# Patient Record
Sex: Male | Born: 1995 | Race: Black or African American | Hispanic: No | Marital: Married | Smoking: Current every day smoker
Health system: Southern US, Community
[De-identification: ages and names within clinical notes are randomized; demographics above are authoritative.]

## PROBLEM LIST (undated history)

## (undated) DIAGNOSIS — J45909 Unspecified asthma, uncomplicated: Secondary | ICD-10-CM

## (undated) HISTORY — PX: HERNIA REPAIR: SHX51

---
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
## 2017-02-22 ENCOUNTER — Encounter: Payer: Self-pay | Admitting: *Deleted

## 2017-02-22 ENCOUNTER — Emergency Department
Admission: EM | Admit: 2017-02-22 | Discharge: 2017-02-22 | Disposition: A | Discharge status: Home / Self Care | Payer: Self-pay | Attending: Emergency Medicine | Admitting: Emergency Medicine

## 2017-02-22 DIAGNOSIS — R197 Diarrhea, unspecified: Secondary | ICD-10-CM | POA: Insufficient documentation

## 2017-02-22 LAB — CBC
HCT: 40.1 % (ref 40.0–52.0)
Hemoglobin: 13.6 g/dL (ref 13.0–18.0)
MCH: 28.9 pg (ref 26.0–34.0)
MCHC: 33.9 g/dL (ref 32.0–36.0)
MCV: 85.1 fL (ref 80.0–100.0)
PLATELETS: 406 10*3/uL (ref 150–440)
RBC: 4.71 MIL/uL (ref 4.40–5.90)
RDW: 14.1 % (ref 11.5–14.5)
WBC: 6 10*3/uL (ref 3.8–10.6)

## 2017-02-22 LAB — COMPREHENSIVE METABOLIC PANEL
ALK PHOS: 64 U/L (ref 38–126)
ALT: 22 U/L (ref 17–63)
AST: 29 U/L (ref 15–41)
Albumin: 4.3 g/dL (ref 3.5–5.0)
Anion gap: 10 (ref 5–15)
BILIRUBIN TOTAL: 0.5 mg/dL (ref 0.3–1.2)
BUN: 7 mg/dL (ref 6–20)
CALCIUM: 9.6 mg/dL (ref 8.9–10.3)
CHLORIDE: 106 mmol/L (ref 101–111)
CO2: 23 mmol/L (ref 22–32)
CREATININE: 0.89 mg/dL (ref 0.61–1.24)
GFR calc Af Amer: >60 mL/min (ref >60)
Glucose, Bld: 97 mg/dL (ref 65–99)
Potassium: 3.5 mmol/L (ref 3.5–5.1)
Sodium: 139 mmol/L (ref 135–145)
Total Protein: 8 g/dL (ref 6.5–8.1)

## 2017-02-22 LAB — URINALYSIS, COMPLETE (UACMP) WITH MICROSCOPIC
BILIRUBIN URINE: NEGATIVE
Bacteria, UA: NONE SEEN
Glucose, UA: NEGATIVE mg/dL
Hgb urine dipstick: NEGATIVE
Ketones, ur: NEGATIVE mg/dL
LEUKOCYTES UA: NEGATIVE
Nitrite: NEGATIVE
PH: 5 (ref 5.0–8.0)
Protein, ur: NEGATIVE mg/dL
RBC / HPF: NONE SEEN RBC/hpf (ref 0–5)
SPECIFIC GRAVITY, URINE: 1.014 (ref 1.005–1.030)
SQUAMOUS EPITHELIAL / LPF: NONE SEEN

## 2017-02-22 LAB — ETHANOL: Alcohol, Ethyl (B): <10 mg/dL (ref <10)

## 2017-02-22 LAB — LIPASE, BLOOD: LIPASE: 23 U/L (ref 11–51)

## 2017-02-22 MED ORDER — LOPERAMIDE HCL 2 MG PO CAPS
4.0000 mg | ORAL_CAPSULE | Freq: Once | ORAL | Status: AC
  Administered 2017-02-22: 4 mg via ORAL
  Filled 2017-02-22: qty 2

## 2017-02-22 MED ORDER — LOPERAMIDE HCL 2 MG PO TABS: 2.0000 mg | ORAL_TABLET | Freq: Four times a day (QID) | ORAL | 0 refills | Status: DC | PRN

## 2017-02-22 NOTE — ED Triage Notes (Signed)
Pt reports diarrhea for several days.  No vomiting.  No abd pain.  Pt drinks etoh everyday.  Pt reports blood in stool 2 days ago.  Pt alert.  Pt reports etoh use today.

## 2017-02-22 NOTE — ED Provider Notes (Signed)
[facility] Emergency Department Provider Note  Time seen: 7:00 PM  I have reviewed the triage vital signs and the nursing notes.   HISTORY  Chief Complaint Diarrhea    HPI Alex Ochoa is a 21 y.o. male With no past medical history who presents to the emergency Department for diarrhea. According to the patient 3 days ago he had a hard stool. Noted some blood on the toilet paper. He states yesterday and today he has had significant amounts of diarrhea up to 10 episodes per day. Denies any blood or black in his stool. Denies any nausea vomiting or abdominal pain. Denies any fever. 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 Has not tried any over-the-counter diarrhea medications as of yet.  No past medical history on file.  There are no active problems to display for this patient.   No past surgical history on file.  Prior to Admission medications   Not on File    No Known Allergies  No family history on file.  Social History Social History  Substance Use Topics  . Smoking status: Never Smoker  . Smokeless tobacco: Never Used  . Alcohol use Yes    Review of Systems Constitutional: Negative for fever. Cardiovascular: Negative for chest pain. Respiratory: Negative for shortness of breath. Gastrointestinal: Negative for abdominal pain. Positive for diarrhea. Genitourinary: Negative for dysuria All other ROS negative  ____________________________________________   PHYSICAL EXAM:  VITAL SIGNS: ED Triage Vitals  Enc Vitals Group     BP 02/22/17 1549 118/61     Pulse Rate 02/22/17 1549 80     Resp 02/22/17 1549 18     Temp 02/22/17 1549 98.6 F (37 C)     Temp Source 02/22/17 1549 Oral     SpO2 02/22/17 1549 99 %     Weight 02/22/17 1549 140 lb (63.5 kg)     Height 02/22/17 1549  (1.651 m)     Head Circumference --    Peak Flow --      Pain Score 02/22/17 1548 3     Pain Loc --      Pain Edu? --      Excl. in GC? --     Constitutional: Alert and oriented. Well appearing and in no distress. Eyes: Normal exam ENT   Head: Normocephalic and atraumatic.   Mouth/Throat: Mucous membranes are moist. Cardiovascular: Normal rate, regular rhythm. No murmur Respiratory: Normal respiratory effort without tachypnea nor retractions. Breath sounds are clear Gastrointestinal: Soft and nontender. No distention.   Musculoskeletal: Nontender with normal range of motion in all extremities. Neurologic:  Normal speech and language. No gross focal neurologic deficits  Skin:  Skin is warm, dry and intact.  Psychiatric: Mood and affect are normal.   ____________________________________________   INITIAL IMPRESSION / ASSESSMENT AND PLAN / ED COURSE  Pertinent labs & imaging results that were available during my care of the patient were reviewed by me and considered in my medical decision making (see chart for details).  patient presents to the emergency Department for diarrhea. Denies nausea vomiting or abdominal pain. Differential this time include infectious diarrhea, gastroenteritis, enteritis, colitis or diverticulitis. Overall the patient appears extremely well the emergency department with a completely nontender abdominal exam. Patient's labs are normal including normal white blood cell count. No signs of dehydration on blood work. We will treat  with loperamide. We will excuse from work. I discussed supportive care at home including plenty fluids and continue use of loperamide. Patient agreeable to plan. I also discussed return precautions.  ____________________________________________   FINAL CLINICAL IMPRESSION(S) / ED DIAGNOSES  diarrhea    Minna Antis, MD 02/22/17 1902

## 2017-03-22 ENCOUNTER — Emergency Department: Payer: Self-pay

## 2017-03-22 ENCOUNTER — Emergency Department
Admission: EM | Admit: 2017-03-22 | Discharge: 2017-03-22 | Disposition: A | Discharge status: Home / Self Care | Payer: Self-pay | Attending: Emergency Medicine | Admitting: Emergency Medicine

## 2017-03-22 DIAGNOSIS — S300XXA Contusion of lower back and pelvis, initial encounter: Secondary | ICD-10-CM | POA: Insufficient documentation

## 2017-03-22 DIAGNOSIS — Y9301 Activity, walking, marching and hiking: Secondary | ICD-10-CM | POA: Insufficient documentation

## 2017-03-22 DIAGNOSIS — Y999 Unspecified external cause status: Secondary | ICD-10-CM | POA: Insufficient documentation

## 2017-03-22 DIAGNOSIS — W108XXA Fall (on) (from) other stairs and steps, initial encounter: Secondary | ICD-10-CM | POA: Insufficient documentation

## 2017-03-22 DIAGNOSIS — Y929 Unspecified place or not applicable: Secondary | ICD-10-CM | POA: Insufficient documentation

## 2017-03-22 MED ORDER — TRAMADOL HCL 50 MG PO TABS: 50.0000 mg | ORAL_TABLET | Freq: Four times a day (QID) | ORAL | 0 refills | Status: DC | PRN

## 2017-03-22 MED ORDER — IBUPROFEN 600 MG PO TABS: 600.0000 mg | ORAL_TABLET | Freq: Three times a day (TID) | ORAL | 0 refills | Status: DC | PRN

## 2017-03-22 NOTE — ED Triage Notes (Signed)
Tailbone pain after falling down stairs yesterday. Pain worse with sitting. Pt ambulates. Pt alert and oriented X4, active, cooperative, pt in NAD. RR even and unlabored, color WNL.

## 2017-03-22 NOTE — ED Provider Notes (Signed)
[facility] Emergency Department Provider Note   ____________________________________________   None    (approximate)  I have reviewed the triage vital signs and the nursing notes.   HISTORY  Chief Complaint Tailbone Pain    HPI Shelby DubinDeion H Venditto is a 21 y.o. male patient complain of tailbone pain status post falling down stairs yesterday. Patient stated pain increases with sitting. Patient denies any radicular component to his pain. Patient denies bladder or bowel dysfunction.Patient rates his pain as a 10 over 10. Patient described a pain as "achy". No palliative measures for complaint.   History reviewed. No pertinent past medical history.  There are no active problems to display for this patient.   History reviewed. No pertinent surgical history.  Prior to Admission medications   Medication Sig Start Date End Date Taking? Authorizing Provider  loperamide (IMODIUM A-D) 2 MG tablet Take 1 tablet (2 mg total) by mouth 4 (four) times daily as needed for diarrhea or loose stools. 02/22/17   Minna AntisPaduchowski, Kevin, MD    Allergies Patient has no known allergies.  No family history on file.  Social History Social History  Substance Use Topics  . Smoking status: Never Smoker  . Smokeless tobacco: Never Used  . Alcohol use Yes    Review of Systems Constitutional: No fever/chills Eyes: No visual changes. ENT: No sore throat. Cardiovascular: Denies chest pain. Respiratory: Denies shortness of breath. Gastrointestinal: No abdominal pain.  No nausea, no vomiting.  No diarrhea.  No constipation. Genitourinary: Negative for dysuria. Musculoskeletal: Tailbone pain Skin: Negative for rash. Neurological: Negative for headaches, focal weakness or numbness.   ____________________________________________   PHYSICAL EXAM:  VITAL SIGNS: ED Triage Vitals  Enc Vitals Group     BP 03/22/17 0846 (!) 157/95     Pulse Rate 03/22/17 0846 81     Resp  03/22/17 0846 16     Temp 03/22/17 0846 97.7 F (36.5 C)     Temp Source 03/22/17 0846 Oral     SpO2 03/22/17 0846 98 %     Weight 03/22/17 0846 140 lb (63.5 kg)     Height 03/22/17 0846 5\' 5"  (1.651 m)     Head Circumference --      Peak Flow --      Pain Score 03/22/17 0845 10     Pain Loc --      Pain Edu? --      Excl. in GC? --     Constitutional: Alert and oriented. Well appearing and in no acute distress. Cardiovascular: Normal rate, regular rhythm. Grossly normal heart sounds.  Good peripheral circulation. Elevated blood pressure Respiratory: Normal respiratory effort.  No retractions. Lungs CTAB. Musculoskeletal: No obvious lumbar deformity. Patient moderate guarding palpation L4-S1.  Neurologic:  Normal speech and language. No gross focal neurologic deficits are appreciated. No gait instability. Skin:  Skin is warm, dry and intact. No rash noted. Psychiatric: Mood and affect are normal. Speech and behavior are normal.  ____________________________________________   LABS (all labs ordered are listed, but only abnormal results are displayed)  Labs Reviewed - No data to display ____________________________________________  EKG   ____________________________________________  RADIOLOGY  No results found. No acute findings x-ray of the coccyx.  ___ _________________________________________   PROCEDURES  Procedure(s) performed: None  Procedures  Critical Care performed: No  ____________________________________________   INITIAL IMPRESSION / ASSESSMENT AND PLAN / ED COURSE  As part of my medical decision making, I reviewed the following data within the electronic  MEDICAL RECORD NUMBER    Pain secondary to coccyx contusion. Discussed negative x-ray finding with patient. Patient given discharge care instruction. Patient given a work note and [facility].       ____________________________________________   FINAL CLINICAL IMPRESSION(S) / ED DIAGNOSES  Final diagnoses:  None      NEW MEDICATIONS STARTED DURING THIS VISIT:  New Prescriptions   No medications on file     Note:  This document was prepared using Dragon voice recognition software and may include unintentional dictation errors.    Joni Reining, PA-C 03/22/17 1108    Emily Filbert, MD 03/22/17 [phone]

## 2017-03-22 NOTE — ED Notes (Signed)
S/p fall on stairs, coccyx pain 0/10 unless sitting or on feet for awhile, then 10/10. No bruising noted but area firm to touch. Denies numbness or weakness in legs.

## 2019-01-20 ENCOUNTER — Emergency Department
Admission: EM | Admit: 2019-01-20 | Discharge: 2019-01-20 | Disposition: A | Discharge status: Home / Self Care | Payer: Self-pay | Attending: Emergency Medicine | Admitting: Emergency Medicine

## 2019-01-20 ENCOUNTER — Emergency Department: Payer: Self-pay

## 2019-01-20 ENCOUNTER — Other Ambulatory Visit: Payer: Self-pay

## 2019-01-20 DIAGNOSIS — Y999 Unspecified external cause status: Secondary | ICD-10-CM | POA: Insufficient documentation

## 2019-01-20 DIAGNOSIS — F17210 Nicotine dependence, cigarettes, uncomplicated: Secondary | ICD-10-CM | POA: Insufficient documentation

## 2019-01-20 DIAGNOSIS — Y929 Unspecified place or not applicable: Secondary | ICD-10-CM | POA: Insufficient documentation

## 2019-01-20 DIAGNOSIS — S20211A Contusion of right front wall of thorax, initial encounter: Secondary | ICD-10-CM | POA: Insufficient documentation

## 2019-01-20 DIAGNOSIS — Y939 Activity, unspecified: Secondary | ICD-10-CM | POA: Insufficient documentation

## 2019-01-20 MED ORDER — NAPROXEN 500 MG PO TABS: 500.0000 mg | ORAL_TABLET | Freq: Two times a day (BID) | ORAL | 2 refills | Status: DC

## 2019-01-20 MED ORDER — NAPROXEN 500 MG PO TABS
500.0000 mg | ORAL_TABLET | Freq: Once | ORAL | Status: AC
  Administered 2019-01-20: 500 mg via ORAL
  Filled 2019-01-20: qty 1
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
## 2019-09-28 ENCOUNTER — Emergency Department (HOSPITAL_COMMUNITY)
Admission: EM | Admit: 2019-09-28 | Discharge: 2019-09-28 | Disposition: A | Discharge status: Home / Self Care | Payer: Self-pay | Attending: Emergency Medicine | Admitting: Emergency Medicine

## 2019-09-28 ENCOUNTER — Other Ambulatory Visit: Payer: Self-pay

## 2019-09-28 ENCOUNTER — Encounter (HOSPITAL_COMMUNITY): Payer: Self-pay | Admitting: Emergency Medicine

## 2019-09-28 ENCOUNTER — Emergency Department (HOSPITAL_COMMUNITY): Payer: Self-pay

## 2019-09-28 DIAGNOSIS — Z5321 Procedure and treatment not carried out due to patient leaving prior to being seen by health care provider: Secondary | ICD-10-CM | POA: Insufficient documentation

## 2019-09-28 DIAGNOSIS — R369 Urethral discharge, unspecified: Secondary | ICD-10-CM | POA: Insufficient documentation

## 2019-09-28 DIAGNOSIS — M79644 Pain in right finger(s): Secondary | ICD-10-CM | POA: Insufficient documentation

## 2019-09-28 LAB — CBC WITH DIFFERENTIAL/PLATELET
Abs Immature Granulocytes: 0.03 10*3/uL (ref 0.00–0.07)
Basophils Absolute: 0.1 10*3/uL (ref 0.0–0.1)
Basophils Relative: 1 %
Eosinophils Absolute: 0.1 10*3/uL (ref 0.0–0.5)
Eosinophils Relative: 1 %
HCT: 42.6 % (ref 39.0–52.0)
Hemoglobin: 13.9 g/dL (ref 13.0–17.0)
Immature Granulocytes: 0 %
Lymphocytes Relative: 24 %
Lymphs Abs: 2.2 10*3/uL (ref 0.7–4.0)
MCH: 29.1 pg (ref 26.0–34.0)
MCHC: 32.6 g/dL (ref 30.0–36.0)
MCV: 89.3 fL (ref 80.0–100.0)
Monocytes Absolute: 1 10*3/uL (ref 0.1–1.0)
Monocytes Relative: 10 %
Neutro Abs: 5.9 10*3/uL (ref 1.7–7.7)
Neutrophils Relative %: 64 %
Platelets: 445 10*3/uL — ABNORMAL HIGH (ref 150–400)
RBC: 4.77 MIL/uL (ref 4.22–5.81)
RDW: 12.9 % (ref 11.5–15.5)
WBC: 9.2 10*3/uL (ref 4.0–10.5)
nRBC: 0 % (ref 0.0–0.2)

## 2019-09-28 LAB — BASIC METABOLIC PANEL
Anion gap: 13 (ref 5–15)
BUN: 10 mg/dL (ref 6–20)
CO2: 20 mmol/L — ABNORMAL LOW (ref 22–32)
Calcium: 9.5 mg/dL (ref 8.9–10.3)
Chloride: 107 mmol/L (ref 98–111)
Creatinine, Ser: 0.92 mg/dL (ref 0.61–1.24)
GFR calc Af Amer: >60 mL/min (ref >60)
GFR calc non Af Amer: >60 mL/min (ref >60)
Glucose, Bld: 103 mg/dL — ABNORMAL HIGH (ref 70–99)
Potassium: 3.7 mmol/L (ref 3.5–5.1)
Sodium: 140 mmol/L (ref 135–145)

## 2019-09-28 LAB — URINALYSIS, ROUTINE W REFLEX MICROSCOPIC
Bilirubin Urine: NEGATIVE
Glucose, UA: NEGATIVE mg/dL
Ketones, ur: NEGATIVE mg/dL
Nitrite: NEGATIVE
Protein, ur: NEGATIVE mg/dL
Specific Gravity, Urine: 1.004 — ABNORMAL LOW (ref 1.005–1.030)
WBC, UA: >50 WBC/hpf — ABNORMAL HIGH (ref 0–5)
pH: 6 (ref 5.0–8.0)

## 2019-09-28 NOTE — ED Triage Notes (Signed)
Patient reports penile discharge this week , requesting STD screening , patient added right thumb pain with swelling injured at work this week .

## 2019-09-28 NOTE — ED Notes (Signed)
Pt called 3x for rooming to no response. Pt not seen anywhere in waiting room area/lobby or back in triage.

## 2019-10-06 ENCOUNTER — Other Ambulatory Visit: Payer: Self-pay

## 2019-10-06 ENCOUNTER — Encounter: Payer: Self-pay | Admitting: Emergency Medicine

## 2019-10-06 ENCOUNTER — Emergency Department: Payer: Self-pay

## 2019-10-06 ENCOUNTER — Emergency Department
Admission: EM | Admit: 2019-10-06 | Discharge: 2019-10-06 | Disposition: A | Discharge status: Home / Self Care | Payer: Self-pay | Attending: Emergency Medicine | Admitting: Emergency Medicine

## 2019-10-06 DIAGNOSIS — M778 Other enthesopathies, not elsewhere classified: Secondary | ICD-10-CM | POA: Insufficient documentation

## 2019-10-06 DIAGNOSIS — Y990 Civilian activity done for income or pay: Secondary | ICD-10-CM | POA: Insufficient documentation

## 2019-10-06 DIAGNOSIS — A549 Gonococcal infection, unspecified: Secondary | ICD-10-CM

## 2019-10-06 DIAGNOSIS — Z79899 Other long term (current) drug therapy: Secondary | ICD-10-CM | POA: Insufficient documentation

## 2019-10-06 DIAGNOSIS — Y939 Activity, unspecified: Secondary | ICD-10-CM | POA: Insufficient documentation

## 2019-10-06 DIAGNOSIS — S93402A Sprain of unspecified ligament of left ankle, initial encounter: Secondary | ICD-10-CM | POA: Insufficient documentation

## 2019-10-06 DIAGNOSIS — X503XXA Overexertion from repetitive movements, initial encounter: Secondary | ICD-10-CM | POA: Insufficient documentation

## 2019-10-06 DIAGNOSIS — Y929 Unspecified place or not applicable: Secondary | ICD-10-CM | POA: Insufficient documentation

## 2019-10-06 DIAGNOSIS — F17200 Nicotine dependence, unspecified, uncomplicated: Secondary | ICD-10-CM | POA: Insufficient documentation

## 2019-10-06 LAB — CHLAMYDIA/NGC RT PCR (ARMC ONLY)??????????: N gonorrhoeae: DETECTED — AB

## 2019-10-06 LAB — CHLAMYDIA/NGC RT PCR (ARMC ONLY): Chlamydia Tr: NOT DETECTED

## 2019-10-06 MED ORDER — LIDOCAINE HCL (PF) 1 % IJ SOLN
INTRAMUSCULAR | Status: AC
  Filled 2019-10-06: qty 5

## 2019-10-06 MED ORDER — DOXYCYCLINE HYCLATE 100 MG PO TABS
100.0000 mg | ORAL_TABLET | Freq: Once | ORAL | Status: AC
  Administered 2019-10-06: 100 mg via ORAL
  Filled 2019-10-06: qty 1

## 2019-10-06 MED ORDER — CEFTRIAXONE SODIUM 1 G IJ SOLR
500.0000 mg | Freq: Once | INTRAMUSCULAR | Status: AC
  Administered 2019-10-06: 16:00:00 500 mg via INTRAMUSCULAR
  Filled 2019-10-06: qty 10

## 2019-10-06 MED ORDER — AZITHROMYCIN 1 G PO PACK
1.0000 g | PACK | Freq: Once | ORAL | Status: DC
Start: 2019-10-06 — End: 2019-10-06
  Filled 2019-10-06: qty 1

## 2019-10-06 MED ORDER — CEFTRIAXONE SODIUM 250 MG IJ SOLR
250.0000 mg | Freq: Once | INTRAMUSCULAR | Status: DC
  Filled 2019-10-06: qty 250

## 2019-10-06 MED ORDER — DOXYCYCLINE HYCLATE 100 MG PO TABS: 100.0000 mg | ORAL_TABLET | Freq: Two times a day (BID) | ORAL | 0 refills | Status: AC

## 2019-10-06 NOTE — ED Provider Notes (Addendum)
[facility] Emergency Department Provider Note ____________________________________________  Time seen: 1521  I have reviewed the triage vital signs and the nursing notes.  HISTORY  Chief Complaint  Hand Injury and Penile Discharge  HPI Alex Ochoa is a 24 y.o. male presents to the ED with 3 separate complaints.   Patient presents for evaluation of right hand pain following injury. He describes swelling to the right thumb for about 3 weeks.  He denies any direct trauma, sprain, or contusion.  He does work in [employer] and notes pain across the extensor tendon of the thumb region.  He also complains of chronic swelling to the medial left ankle. 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 He also has a separate complaint of penile discharge earlier in the week, and is requesting STD evaluation.  He denies any interim fever, chills, or sweats.  History reviewed. No pertinent past medical history.  There are no problems to display for this patient.   Past Surgical History:  Procedure Laterality Date  . HERNIA REPAIR      Prior to Admission medications   Medication Sig Start Date End Date Taking? Authorizing Provider  doxycycline (VIBRA-TABS) 100 MG tablet Take 1 tablet (100 mg total) by mouth 2 (two) times daily for 7 days. 10/07/19 10/14/19  Rees Matura, Charlesetta Ivory, PA-C  ibuprofen (ADVIL,MOTRIN) 600 MG tablet Take 1 tablet (600 mg total) by mouth every 8 (eight) hours as needed. 03/22/17   Joni Reining, PA-C  loperamide (IMODIUM A-D) 2 MG tablet Take 1 tablet (2 mg total) by mouth 4 (four) times daily as needed for diarrhea or loose stools. 02/22/17   Minna Antis, MD  naproxen (NAPROSYN) 500 MG tablet Take 1 tablet (500 mg total) by mouth 2 (two) times daily with a meal. 01/20/19   Jene Every, MD  traMADol (ULTRAM) 50 MG tablet Take 1 tablet (50 mg total) by mouth every 6 (six) hours as needed for  moderate pain. 03/22/17   Joni Reining, PA-C    Allergies Patient has no known allergies.  No family history on file.  Social History Social History   Tobacco Use  . Smoking status: Current Every Day Smoker  . Smokeless tobacco: Never Used  Substance Use Topics  . Alcohol use: Yes    Alcohol/week: 24.0 standard drinks    Types: 24 Cans of beer per week  . Drug use: No    Review of Systems  Constitutional: Negative for fever. Cardiovascular: Negative for chest pain. Respiratory: Negative for shortness of breath. Gastrointestinal: Negative for abdominal pain, vomiting and diarrhea. Genitourinary: Negative for dysuria. Reports penile discharge. Musculoskeletal: Negative for back pain. Right hand pain as above. Medial left ankle pain  Skin: Negative for rash. Neurological: Negative for headaches, focal weakness or numbness. ____________________________________________  PHYSICAL EXAM:  VITAL SIGNS: ED Triage Vitals  Enc Vitals Group     BP 10/06/19 1409 (!) 158/73     Pulse Rate 10/06/19 1409 83     Resp 10/06/19 1409 16     Temp 10/06/19 1409 98.8 F (37.1 C)     Temp Source 10/06/19 1409 Oral     SpO2 10/06/19 1409 100 %     Weight 10/06/19 1411 154 lb 5.2 oz (70 kg)     Height 10/06/19 1411 5\' 5"  (1.651 m)     Head Circumference --      Peak Flow --      Pain Score  10/06/19 1410 4     Pain Loc --      Pain Edu? --      Excl. in [place]? --     Constitutional: Alert and oriented. Well appearing and in no distress. Head: Normocephalic and atraumatic. Eyes: Conjunctivae are normal. Normal extraocular movements Cardiovascular: Normal rate, regular rhythm. Normal distal pulses. Respiratory: Normal respiratory effort. No wheezes/rales/rhonchi. Gastrointestinal: Soft and nontender. No distention. Musculoskeletal: Nontender with normal range of motion in all extremities.  Neurologic:  Normal gait without ataxia. Normal speech and language. No gross focal neurologic  deficits are appreciated. Skin:  Skin is warm, dry and intact. No rash noted. Psychiatric: Mood and affect are normal. Patient exhibits appropriate insight and judgment. ____________________________________________   LABS (pertinent positives/negatives) Labs Reviewed  CHLAMYDIA/NGC RT PCR ([facility] ONLY)  ____________________________________________   RADIOLOGY  DG Right Hand  Negative  DG Left Ankle  Negative  I, Amire Gossen V Bacon-Xayla Puzio, personally viewed and evaluated these images (plain radiographs) as part of my medical decision making, as well as reviewing the written report by the radiologist. ____________________________________________  PROCEDURES  Rocephin 500 mg IM Doxycycline 100 mg PO Thumb spica splint Ace bandage  Procedures ____________________________________________  INITIAL IMPRESSION / ASSESSMENT AND PLAN / ED COURSE  Patient with ED evaluation of multiple complaints including penile discharge after gonorrhea exposure.  Patient will be treated empirically for gonorrhea chlamydia with a single IM injection of Rocephin and 8 weeks worth of doxycycline taken twice daily.  His x-ray of the left ankle and right hand was negative for any acute fractures or dislocation.  Symptoms are left, consistent with a tendinitis.  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  Patient is referred to orthopedics for further evaluation management of her symptoms.  He is released to work with activities as tolerated, and use of thumb spica splint for support.  Alex Ochoa was evaluated in Emergency Department on 10/06/2019 for the symptoms described in the history of present illness. He was evaluated in the context of the global COVID-19 pandemic, which necessitated consideration that the patient might be at risk for infection with the SARS-CoV-2 virus that causes COVID-19. Institutional protocols and algorithms that pertain to  the evaluation of patients at risk for COVID-19 are in a state of rapid change based on information released by regulatory bodies including the CDC and federal and state organizations. These policies and algorithms were followed during the patient's care in the ED. ____________________________________________  FINAL CLINICAL IMPRESSION(S) / ED DIAGNOSES  Final diagnoses:  Thumb tendonitis  Gonorrhea in male  Sprain of left ankle, unspecified ligament, initial encounter      Melvenia Needles, PA-C 10/06/19 1640    Labrenda Lasky, Dannielle Karvonen, PA-C 10/06/19 1640    Delman Kitten, MD 10/07/19 0100

## 2019-10-06 NOTE — ED Notes (Signed)
Pt reports injury to right hand and left heel that occurred two weeks ago. Pt states he was seen in [place] but had to leave prior to finding out xray results and results of gonorrhea test for penile discharge. Right thumb appears swollen.

## 2019-10-06 NOTE — Discharge Instructions (Addendum)
Your XRs are negative for fracture or dislocation. Your thumb is being splinted due to tendinitis. Wear the splint for work activities. Apply ice packs to reduce swelling.  Use Epson salt soaks to increase range of motion.  Wear the Ace bandage on your ankle or consider buying a neoprene ankle sleeve for comfort.  You may follow-up with orthopedics for ongoing symptom management.  Take Aleve twice daily as discussed.

## 2019-10-06 NOTE — ED Triage Notes (Signed)
Pt to ED via POV c/o injury to the right hand. Pt states that it has been swollen for about 3 weeks. Pt also reports that he would like to be checked for STD. Pt states that he had penile discharge earlier this week. Pt is in NAD.

## 2019-10-07 ENCOUNTER — Telehealth: Payer: Self-pay | Admitting: Emergency Medicine

## 2019-10-07 NOTE — Telephone Encounter (Signed)
Called patient and gave std results.

## 2020-06-19 ENCOUNTER — Emergency Department
Admission: EM | Admit: 2020-06-19 | Discharge: 2020-06-19 | Disposition: A | Discharge status: Home / Self Care | Payer: Medicaid Other | Attending: Emergency Medicine | Admitting: Emergency Medicine

## 2020-06-19 ENCOUNTER — Other Ambulatory Visit: Payer: Self-pay

## 2020-06-19 DIAGNOSIS — R6883 Chills (without fever): Secondary | ICD-10-CM | POA: Insufficient documentation

## 2020-06-19 DIAGNOSIS — Z20822 Contact with and (suspected) exposure to covid-19: Secondary | ICD-10-CM | POA: Insufficient documentation

## 2020-06-19 DIAGNOSIS — F17200 Nicotine dependence, unspecified, uncomplicated: Secondary | ICD-10-CM | POA: Insufficient documentation

## 2020-06-19 LAB — SARS CORONAVIRUS 2 (TAT 6-24 HRS): SARS Coronavirus 2: NEGATIVE
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
## 2020-06-19 NOTE — ED Provider Notes (Signed)
[facility] Emergency Department Provider Note  ____________________________________________   Event Date/Time   First MD Initiated Contact with Patient 06/19/20 1117     (approximate)  I have reviewed the triage vital signs and the nursing notes.   HISTORY  Chief Complaint Covid Exposure  HPI Alex Ochoa is a 25 y.o. male who presents to the emergency department for evaluation with COVID test.  Patient states that he had a known COVID exposure several days ago.  He developed occasion of chills yesterday.  He denies any fever, cough, shortness of breath, abdominal pain, nausea, vomiting or diarrhea.  He is not vaccinated against COVID-19.          History reviewed. No pertinent past medical history.  There are no problems to display for this patient.   Past Surgical History:  Procedure Laterality Date  . HERNIA REPAIR      Prior to Admission medications   Medication Sig Start Date End Date Taking? Authorizing Provider  ibuprofen (ADVIL,MOTRIN) 600 MG tablet Take 1 tablet (600 mg total) by mouth every 8 (eight) hours as needed. 03/22/17   Joni Reining, PA-C  loperamide (IMODIUM A-D) 2 MG tablet Take 1 tablet (2 mg total) by mouth 4 (four) times daily as needed for diarrhea or loose stools. 02/22/17   Minna Antis, MD  naproxen (NAPROSYN) 500 MG tablet Take 1 tablet (500 mg total) by mouth 2 (two) times daily with a meal. 01/20/19   Jene Every, MD  traMADol (ULTRAM) 50 MG tablet Take 1 tablet (50 mg total) by mouth every 6 (six) hours as needed for moderate pain. 03/22/17   Joni Reining, PA-C    Allergies Patient has no known allergies.  No family history on file.  Social History Social History   Tobacco Use  . Smoking status: Current Every Day Smoker  . Smokeless tobacco: Never Used  Substance Use Topics  . Alcohol use: Yes    Alcohol/week: 24.0 standard drinks    Types: 24 Cans of beer per week  . Drug use: No     Review of Systems Constitutional: No fever, a few episodes of chills Eyes: No visual changes. ENT: No sore throat. Cardiovascular: Denies chest pain. Respiratory: Denies shortness of breath. Gastrointestinal: No abdominal pain.  No nausea, no vomiting.  No diarrhea.  No constipation. Genitourinary: Negative for dysuria. Musculoskeletal: Negative for back pain. Skin: Negative for rash. Neurological: Negative for headaches, focal weakness or numbness.  ____________________________________________   PHYSICAL EXAM:  VITAL SIGNS: ED Triage Vitals  Enc Vitals Group     BP 06/19/20 1024 (!) 150/93     Pulse Rate 06/19/20 1024 98     Resp 06/19/20 1024 16     Temp 06/19/20 1024 97.9 F (36.6 C)     Temp Source 06/19/20 1024 Oral     SpO2 06/19/20 1024 99 %     Weight 06/19/20 1026 138 lb (62.6 kg)     Height 06/19/20 1026 5\' 5"  (1.651 m)     Head Circumference --      Peak Flow --      Pain Score 06/19/20 1025 0     Pain Loc --      Pain Edu? --      Excl. in GC? --     Constitutional: Alert and oriented. Well appearing and in no acute distress. Eyes: Conjunctivae are normal. PERRL. EOMI. Head: Atraumatic. Nose: No congestion/rhinnorhea. Mouth/Throat: Mucous membranes are moist.  Oropharynx  non-erythematous. Neck: No stridor.   Cardiovascular: Normal rate, regular rhythm. Grossly normal heart sounds.  Good peripheral circulation. Respiratory: Normal respiratory effort.  No retractions. Lungs CTAB. Gastrointestinal: Soft and nontender. No distention. No abdominal bruits. No CVA tenderness. Musculoskeletal: No lower extremity tenderness nor edema.  No joint effusions. Neurologic:  Normal speech and language. No gross focal neurologic deficits are appreciated. No gait instability. Skin:  Skin is warm, dry and intact. No rash noted. Psychiatric: Mood and affect are normal. Speech and behavior are normal.  ____________________________________________   LABS (all labs  ordered are listed, but only abnormal results are displayed)  Labs Reviewed  SARS CORONAVIRUS 2 (TAT 6-24 HRS)    ____________________________________________   INITIAL IMPRESSION / ASSESSMENT AND PLAN / ED COURSE  As part of my medical decision making, I reviewed the following data within the electronic MEDICAL RECORD NUMBER Nursing notes reviewed and incorporated and Notes from prior ED visits        Patient is a 25 year old male who presents emergency department requesting COVID-19 testing after recent exposure.  He had 1 episode of chills yesterday, otherwise patient has not had any symptoms.  COVID test was obtained.  Patient given work note.  Advised to return should he develop any worsening symptoms or shortness of breath.      ____________________________________________   FINAL CLINICAL IMPRESSION(S) / ED DIAGNOSES  Final diagnoses:  Suspected COVID-19 virus infection     ED Discharge Orders    None      *Please note:  Alex Ochoa was evaluated in Emergency Department on 06/19/2020 for the symptoms described in the history of present illness. He was evaluated in the context of the global COVID-19 pandemic, which necessitated consideration that the patient might be at risk for infection with the SARS-CoV-2 virus that causes COVID-19. Institutional protocols and algorithms that pertain to the evaluation of patients at risk for COVID-19 are in a state of rapid change based on information released by regulatory bodies including the CDC and federal and state organizations. These policies and algorithms were followed during the patient's care in the ED.  Some ED evaluations and interventions may be delayed as a result of limited staffing during and the pandemic.*   Note:  This document was prepared using Dragon voice recognition software and may include unintentional dictation errors.   Lucy Chris, PA 06/19/20 1555    Dionne Bucy, MD 06/20/20 1525

## 2020-06-19 NOTE — ED Notes (Signed)
See triage note  Presents with chills  Possible fever

## 2020-08-05 ENCOUNTER — Emergency Department: Payer: Medicaid Other

## 2020-08-05 ENCOUNTER — Other Ambulatory Visit: Payer: Self-pay

## 2020-08-05 ENCOUNTER — Emergency Department
Admission: EM | Admit: 2020-08-05 | Discharge: 2020-08-05 | Disposition: A | Discharge status: Home / Self Care | Payer: Medicaid Other | Attending: Emergency Medicine | Admitting: Emergency Medicine

## 2020-08-05 DIAGNOSIS — S8392XA Sprain of unspecified site of left knee, initial encounter: Secondary | ICD-10-CM | POA: Insufficient documentation

## 2020-08-05 DIAGNOSIS — M542 Cervicalgia: Secondary | ICD-10-CM | POA: Insufficient documentation

## 2020-08-05 DIAGNOSIS — S96111A Strain of muscle and tendon of long extensor muscle of toe at ankle and foot level, right foot, initial encounter: Secondary | ICD-10-CM | POA: Insufficient documentation

## 2020-08-05 DIAGNOSIS — F17200 Nicotine dependence, unspecified, uncomplicated: Secondary | ICD-10-CM | POA: Insufficient documentation

## 2020-08-05 DIAGNOSIS — T148XXA Other injury of unspecified body region, initial encounter: Secondary | ICD-10-CM

## 2020-08-05 DIAGNOSIS — J45909 Unspecified asthma, uncomplicated: Secondary | ICD-10-CM | POA: Insufficient documentation

## 2020-08-05 DIAGNOSIS — Y92838 Other recreation area as the place of occurrence of the external cause: Secondary | ICD-10-CM | POA: Insufficient documentation

## 2020-08-05 HISTORY — DX: Unspecified asthma, uncomplicated: J45.909

## 2020-08-05 MED ORDER — MELOXICAM 15 MG PO TABS: 15.0000 mg | ORAL_TABLET | Freq: Every day | ORAL | 2 refills | Status: AC

## 2020-08-05 MED ORDER — BACLOFEN 10 MG PO TABS: 10.0000 mg | ORAL_TABLET | Freq: Three times a day (TID) | ORAL | 0 refills | Status: AC
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
## 2020-08-05 NOTE — Discharge Instructions (Signed)
Follow up with orthopedics if not improving in 5 to 7 days Return to the ER if worsening Use the medication as prescribed Apply ice to any areas that hurt
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
## 2022-05-15 ENCOUNTER — Emergency Department
Admission: EM | Admit: 2022-05-15 | Discharge: 2022-05-15 | Disposition: A | Discharge status: Home / Self Care | Payer: Self-pay | Attending: Emergency Medicine | Admitting: Emergency Medicine

## 2022-05-15 ENCOUNTER — Other Ambulatory Visit: Payer: Self-pay

## 2022-05-15 DIAGNOSIS — Z202 Contact with and (suspected) exposure to infections with a predominantly sexual mode of transmission: Secondary | ICD-10-CM | POA: Insufficient documentation

## 2022-05-15 DIAGNOSIS — R369 Urethral discharge, unspecified: Secondary | ICD-10-CM | POA: Insufficient documentation

## 2022-05-15 MED ORDER — LIDOCAINE HCL (PF) 1 % IJ SOLN
INTRAMUSCULAR | Status: AC
  Administered 2022-05-15: 5 mL
  Filled 2022-05-15: qty 5

## 2022-05-15 MED ORDER — CEFTRIAXONE SODIUM 1 G IJ SOLR
500.0000 mg | Freq: Once | INTRAMUSCULAR | Status: AC
  Administered 2022-05-15: 500 mg via INTRAMUSCULAR
  Filled 2022-05-15: qty 10

## 2022-05-15 MED ORDER — DOXYCYCLINE MONOHYDRATE 100 MG PO TABS: 100.0000 mg | ORAL_TABLET | Freq: Two times a day (BID) | ORAL | 0 refills | Status: AC

## 2022-05-15 NOTE — Discharge Instructions (Addendum)
Take Doxycyline twice daily for seven days.  Please abstain from unprotected sex for 1 week.

## 2022-05-15 NOTE — ED Provider Notes (Signed)
[facility] Provider Note  Patient Contact: 3:27 PM (approximate)   History   SEXUALLY TRANSMITTED DISEASE   HPI  Alex Ochoa is a 26 y.o. male presents to the emergency department with green penile discharge for the past several days after having unprotected sex.  No dysuria or flank pain.  No chest pain, chest tightness or abdominal pain.     Physical Exam   Triage Vital Signs: ED Triage Vitals  Enc Vitals Group     BP 05/15/22 1313 (!) 156/103     Pulse Rate 05/15/22 1313 75     Resp 05/15/22 1313 20     Temp 05/15/22 1313 98.6 F (37 C)     Temp src --      SpO2 05/15/22 1313 98 %     Weight 05/15/22 1314 140 lb (63.5 kg)     Height 05/15/22 1314 5\' 5"  (1.651 m)     Head Circumference --      Peak Flow --      Pain Score 05/15/22 1313 0     Pain Loc --      Pain Edu? --      Excl. in GC? --     Most recent vital signs: Vitals:   05/15/22 1313  BP: (!) 156/103  Pulse: 75  Resp: 20  Temp: 98.6 F (37 C)  SpO2: 98%     General: Alert and in no acute distress. Eyes:  PERRL. EOMI. Head: No acute traumatic findings ENT:      Nose: No congestion/rhinnorhea.      Mouth/Throat: Mucous membranes are moist. Neck: No stridor. No cervical spine tenderness to palpation. Cardiovascular:  Good peripheral perfusion Respiratory: Normal respiratory effort without tachypnea or retractions. Lungs CTAB. Good air entry to the bases with no decreased or absent breath sounds. Gastrointestinal: Bowel sounds 4 quadrants. Soft and nontender to palpation. No guarding or rigidity. No palpable masses. No distention. No CVA tenderness. Musculoskeletal: Full range of motion to all extremities.  Neurologic:  No gross focal neurologic deficits are appreciated.  Skin:   No rash noted Other:   ED Results / Procedures / Treatments   Labs (all labs ordered are listed, but only abnormal results are displayed) Labs Reviewed - No data to  display     PROCEDURES:  Critical Care performed: No  Procedures   MEDICATIONS ORDERED IN ED: Medications  cefTRIAXone (ROCEPHIN) injection 500 mg (has no administration in time range)     IMPRESSION / MDM / ASSESSMENT AND PLAN / ED COURSE  I reviewed the triage vital signs and the nursing notes.                              Assessment and plan STD exposure 26 year old male presents to the emergency department with green penile discharge after having unprotected sex.  Patient was given an injection of Rocephin and started on doxycycline twice daily for 1 week.  Return precautions were given to return with new or worsening symptoms.  All patient questions were answered.     FINAL CLINICAL IMPRESSION(S) / ED DIAGNOSES   Final diagnoses:  STD exposure     Rx / DC Orders   ED Discharge Orders          Ordered    doxycycline (ADOXA) 100 MG tablet  2 times daily        05/15/22 1522  Note:  This document was prepared using Dragon voice recognition software and may include unintentional dictation errors.   Pia Mau [place], PA-C 05/15/22 1529    Georga Hacking, MD 05/15/22 [phone]

## 2022-05-15 NOTE — ED Triage Notes (Signed)
Pt states concern for STI. Pt states a green discharge started 3-4 days ago after sexual intercourse. Pt denies pain or pain with urination.
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
## 2023-08-24 ENCOUNTER — Encounter: Payer: Self-pay | Admitting: Emergency Medicine

## 2023-08-24 ENCOUNTER — Other Ambulatory Visit: Payer: Self-pay

## 2023-08-24 ENCOUNTER — Emergency Department
Admission: EM | Admit: 2023-08-24 | Discharge: 2023-08-24 | Disposition: A | Discharge status: Home / Self Care | Payer: Self-pay | Attending: Emergency Medicine | Admitting: Emergency Medicine

## 2023-08-24 DIAGNOSIS — E876 Hypokalemia: Secondary | ICD-10-CM | POA: Insufficient documentation

## 2023-08-24 DIAGNOSIS — K625 Hemorrhage of anus and rectum: Secondary | ICD-10-CM | POA: Insufficient documentation

## 2023-08-24 LAB — COMPREHENSIVE METABOLIC PANEL WITH GFR
ALT: 28 U/L (ref 0–44)
AST: 31 U/L (ref 15–41)
Albumin: 4.3 g/dL (ref 3.5–5.0)
Alkaline Phosphatase: 66 U/L (ref 38–126)
Anion gap: 13 (ref 5–15)
BUN: 8 mg/dL (ref 6–20)
CO2: 19 mmol/L — ABNORMAL LOW (ref 22–32)
Calcium: 9 mg/dL (ref 8.9–10.3)
Chloride: 103 mmol/L (ref 98–111)
Creatinine, Ser: 0.73 mg/dL (ref 0.61–1.24)
GFR, Estimated: >60 mL/min (ref >60)
Glucose, Bld: 134 mg/dL — ABNORMAL HIGH (ref 70–99)
Potassium: 3.2 mmol/L — ABNORMAL LOW (ref 3.5–5.1)
Sodium: 135 mmol/L (ref 135–145)
Total Bilirubin: 0.4 mg/dL (ref 0.0–1.2)
Total Protein: 7.5 g/dL (ref 6.5–8.1)

## 2023-08-24 LAB — CBC
HCT: 41.2 % (ref 39.0–52.0)
Hemoglobin: 14 g/dL (ref 13.0–17.0)
MCH: 29.5 pg (ref 26.0–34.0)
MCHC: 34 g/dL (ref 30.0–36.0)
MCV: 86.7 fL (ref 80.0–100.0)
Platelets: 380 10*3/uL (ref 150–400)
RBC: 4.75 MIL/uL (ref 4.22–5.81)
RDW: 13.6 % (ref 11.5–15.5)
WBC: 7.1 10*3/uL (ref 4.0–10.5)
nRBC: 0 % (ref 0.0–0.2)

## 2023-08-24 MED ORDER — POTASSIUM CHLORIDE CRYS ER 20 MEQ PO TBCR
40.0000 meq | EXTENDED_RELEASE_TABLET | Freq: Once | ORAL | Status: AC
  Administered 2023-08-24: 40 meq via ORAL
  Filled 2023-08-24: qty 2

## 2023-08-24 NOTE — ED Notes (Signed)
 Dr. Rosalia Hammers at bedside. No blood noted coming from rectum.

## 2023-08-24 NOTE — Discharge Instructions (Addendum)
 You were seen in the ER for your rectal bleeding.  Your labs are reassuring.  Please avoid using any illicit substances and drinking heavily.  I have included information to establish care with a primary care doctor.  Have also included information for follow-up with GI for further evaluation of your rectal bleeding.  Return to the ER for new or worsening symptoms.

## 2023-08-24 NOTE — ED Triage Notes (Addendum)
 Pt c/o multiple episodes of bright red blood in stool over the past several weeks. Not aware of hx of hemorrhoids. Pt admits heavy ETOH use with a case of beer every other day and cocaine use twice weekly. Pt denies pain. No n/v/d.

## 2023-08-24 NOTE — ED Provider Notes (Signed)
 [facility] Provider Note    Event Date/Time   First MD Initiated Contact with Patient 08/24/23 1721     (approximate)   History   Rectal Bleeding   HPI  Alex Ochoa is a 28 year old male presenting to the emergency department for evaluation of rectal bleeding.  Reports that he has had intermittent episodes of rectal bleeding since he was a child but has never seen a doctor for it.  More recently, he has noticed that after he uses cocaine he has to strain to go to the bathroom and has noted bright red blood in his stool.  Estimates that he notes blood in his stool every 2-3 times times going to the bathroom when he is using cocaine.  Otherwise reports it is rare that he sees blood in his stool.  He shows me several pictures on his phone which appear to be bright red blood in the toilet overlying his stool.  He denies any associated abdominal pain.  Denies daily alcohol use, but does drink heavily when he does drink.  No vomiting.  No black stools.    Physical Exam   Triage Vital Signs: ED Triage Vitals  Encounter Vitals Group     BP 08/24/23 1620 (!) 134/98     Systolic BP Percentile --      Diastolic BP Percentile --      Pulse Rate 08/24/23 1620 99     Resp 08/24/23 1620 16     Temp 08/24/23 1620 98.5 F (36.9 C)     Temp Source 08/24/23 1620 Oral     SpO2 08/24/23 1620 100 %     Weight 08/24/23 1622 160 lb (72.6 kg)     Height 08/24/23 1622 5\' 3"  (1.6 m)     Head Circumference --      Peak Flow --      Pain Score 08/24/23 1621 0     Pain Loc --      Pain Education --      Exclude from Growth Chart --     Most recent vital signs: Vitals:   08/24/23 1620  BP: (!) 134/98  Pulse: 99  Resp: 16  Temp: 98.5 F (36.9 C)  SpO2: 100%     General: Awake, interactive  CV:  Regular rate, good peripheral perfusion.  Resp:  Unlabored respirations, lungs clear to auscultation Abd:  Nondistended, soft, nontender to palpation, minimal brown stool  on rectal exam, Hemoccult negative Neuro:  Symmetric facial movement, fluid speech   ED Results / Procedures / Treatments   Labs (all labs ordered are listed, but only abnormal results are displayed) Labs Reviewed  COMPREHENSIVE METABOLIC PANEL WITH GFR - Abnormal; Notable for the following components:      Result Value   Potassium 3.2 (*)    CO2 19 (*)    Glucose, Bld 134 (*)    All other components within normal limits  CBC  POC OCCULT BLOOD, ED     EKG EKG independently reviewed interpreted by myself (ER attending) demonstrates:    RADIOLOGY Imaging independently reviewed and interpreted by myself demonstrates:    PROCEDURES:  Critical Care performed: No  Procedures   MEDICATIONS ORDERED IN ED: Medications  potassium chloride SA (KLOR-CON M) CR tablet 40 mEq (has no administration in time range)     IMPRESSION / MDM / ASSESSMENT AND PLAN / ED COURSE  I reviewed the triage vital signs and the nursing notes.  Differential diagnosis includes,  but is not limited to, anorectal bleeding including hemorrhoids though not obviously noted on my exam, rectal bleeding due to straining in the setting of stimulant use, clinical history not suggestive of upper GI bleed, reassuring abdominal exam, low suspicion acute intra-abdominal process such as colitis  Patient's presentation is most consistent with acute presentation with potential threat to life or bodily function.  28 year old male presenting with intermittent red blood per rectum.  Stable vitals and reassuring labs here with hemoglobin of 14.  Mild hypokalemia, orally replaced.  No blood on rectal exam.  Appears that this typically happens when patient is straining, suspect likely anorectal etiology.  Counseled on avoiding illicit drug use and excessive alcohol use.  Will give information for follow-up with GI and to establish care with primary care doctor.  Also given detox resources.  Strict return precautions provided.   Patient discharged in stable condition.      FINAL CLINICAL IMPRESSION(S) / ED DIAGNOSES   Final diagnoses:  Rectal bleeding     Rx / DC Orders   ED Discharge Orders     None        Note:  This document was prepared using Dragon voice recognition software and may include unintentional dictation errors.   Trinna Post, MD 08/24/23 [phone]

## 2023-08-24 NOTE — ED Notes (Signed)
 Patient declined discharge vital signs.
